# Patient Record
Sex: Female | Born: 1995 | Race: Black or African American | Hispanic: No | Marital: Single | Smoking: Never smoker
Health system: Southern US, Community
[De-identification: ages and names within clinical notes are randomized; demographics above are authoritative.]

## PROBLEM LIST (undated history)

## (undated) DIAGNOSIS — J45909 Unspecified asthma, uncomplicated: Secondary | ICD-10-CM

---
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
## 2015-01-27 ENCOUNTER — Emergency Department
Admission: EM | Admit: 2015-01-27 | Discharge: 2015-01-27 | Disposition: A | Discharge status: Home / Self Care | Payer: Self-pay | Attending: Emergency Medicine | Admitting: Emergency Medicine

## 2015-01-27 ENCOUNTER — Encounter: Payer: Self-pay | Admitting: Emergency Medicine

## 2015-01-27 DIAGNOSIS — R109 Unspecified abdominal pain: Secondary | ICD-10-CM | POA: Insufficient documentation

## 2015-01-27 HISTORY — DX: Unspecified asthma, uncomplicated: J45.909

## 2015-01-27 NOTE — ED Notes (Signed)
Patient ambulatory to triage with steady gait, without difficulty or distress noted; pt reports having mid abd and right sided abd pain for last few hours, no accomp symptoms; denies hx of same

## 2018-01-22 ENCOUNTER — Encounter: Payer: Self-pay | Admitting: Emergency Medicine

## 2018-01-22 ENCOUNTER — Other Ambulatory Visit: Payer: Self-pay

## 2018-01-22 ENCOUNTER — Ambulatory Visit
Admission: EM | Admit: 2018-01-22 | Discharge: 2018-01-22 | Disposition: A | Discharge status: Home / Self Care | Payer: BC Managed Care – PPO

## 2018-01-22 DIAGNOSIS — H01111 Allergic dermatitis of right upper eyelid: Secondary | ICD-10-CM

## 2018-01-22 DIAGNOSIS — H01113 Allergic dermatitis of right eye, unspecified eyelid: Secondary | ICD-10-CM

## 2018-01-22 DIAGNOSIS — H01116 Allergic dermatitis of left eye, unspecified eyelid: Secondary | ICD-10-CM

## 2018-01-22 DIAGNOSIS — H01114 Allergic dermatitis of left upper eyelid: Secondary | ICD-10-CM

## 2018-01-22 MED ORDER — PREDNISONE 20 MG PO TABS: 40.0000 mg | ORAL_TABLET | Freq: Every day | ORAL | 0 refills | Status: AC

## 2018-01-22 NOTE — ED Triage Notes (Signed)
Patient in today c/o 2-3 days of eyes being red, swollen, irritated and some matting. Patient has used OTC Benadryl with some relief.

## 2018-01-22 NOTE — Discharge Instructions (Signed)
Take medication as prescribed. Rest. Drink plenty of fluids.  Avoid rubbing.  Take over-the-counter antihistamine as discussed.  Cool compresses.  Follow up with your primary care physician this week as needed. Return to Urgent care for new or worsening concerns.

## 2018-01-22 NOTE — ED Provider Notes (Signed)
[facility] ____________________________________________  Time seen: Approximately 8:23 AM  I have reviewed the triage vital signs and the nursing notes.   HISTORY  Chief Complaint eye redness   HPI Becky Calhoun is a 22 y.o. female presenting for evaluation of bilateral eyelid itching and some swelling present since Sunday morning.  Reports eyes do feel somewhat itchy, but no pain.  Denies any eye pain, vision changes, vision loss, foreign body sensation, foreign bodies.  Denies any eye drainage.  No surrounding tenderness, swelling or redness.  No accompanying cough, congestion, fever, sore throat or rash.  Does not usually have seasonal allergies.  Does report Friday she had a touch of her eyelash extensions bilaterally to the top lids.  States that this is something that she has had before without any issues.  She did try Benadryl over-the-counter yesterday which did not make much change.  Reports both eyes felt very itchy and she has been frequently rubbing her eyelids.  Steady gait Reports otherwise feels well. Denies chest pain, shortness of breath, abdominal pain. Denies recent sickness. Denies recent antibiotic use.   No LMP recorded. (Menstrual status: Oral contraceptives).Denies pregnancy.   Past Medical History:  Diagnosis Date  . Asthma     There are no active problems to display for this patient.   History reviewed. No pertinent surgical history.   No current facility-administered medications for this encounter.   Current Outpatient Medications:  .  ENSKYCE 0.15-30 MG-MCG tablet, Take 1 tablet by mouth daily., Disp: , Rfl: 3 .  montelukast (SINGULAIR) 10 MG tablet, Take 10 mg by mouth daily., Disp: , Rfl:  .  predniSONE (DELTASONE) 20 MG tablet, Take 2 tablets (40 mg total) by mouth daily., Disp: 10 tablet, Rfl: 0  Allergies Patient has no known allergies.  Family History  Problem Relation Age of Onset  . Asthma Mother   . Healthy Father      Social History Social History   Tobacco Use  . Smoking status: Never Smoker  . Smokeless tobacco: Never Used  Substance Use Topics  . Alcohol use: No  . Drug use: No    Review of Systems Constitutional: No fever/chills Eyes: No visual changes. ENT: No sore throat. Cardiovascular: Denies chest pain. Respiratory: Denies shortness of breath. Gastrointestinal: No abdominal pain.   Skin: Negative for rash.   ____________________________________________   PHYSICAL EXAM:  VITAL SIGNS: ED Triage Vitals [01/22/18 0819]  Enc Vitals Group     BP 126/86     Pulse Rate 91     Resp 16     Temp 99.7 F (37.6 C)     Temp Source Oral     SpO2 100 %     Weight 140 lb (63.5 kg)     Height  (1.702 m)     Head Circumference      Peak Flow      Pain Score 0     Pain Loc      Pain Edu?      Excl. in GC?     Constitutional: Alert and oriented. Well appearing and in no acute distress. Eyes: Conjunctivae are normal, no injection.  Bilateral upper eyelids along eyelash margin mild localized edema with minimal localized erythema, no surrounding erythema, nontender.  No surrounding tenderness, swelling or erythema.  No foreign bodies visualized bilaterally.  No drainage bilaterally.  PERRL. EOMI. no pain with EOMs. ENT      Head: Normocephalic and atraumatic.  Ears: Nontender, no erythema, normal TMs bilaterally.      Nose: No congestion      Mouth/Throat: Mucous membranes are moist.Oropharynx non-erythematous. Neck: No stridor. Supple without meningismus.  Cardiovascular: Normal rate, regular rhythm. Grossly normal heart sounds.  Good peripheral circulation. Respiratory: Normal respiratory effort without tachypnea nor retractions. Breath sounds are clear and equal bilaterally. No wheezes, rales, rhonchi. Musculoskeletal:  Steady gait. Neurologic:  Normal speech and language. Speech is normal. No gait instability.  Skin:  Skin is warm, dry and intact. No rash  noted. Psychiatric: Mood and affect are normal. Speech and behavior are normal. Patient exhibits appropriate insight and judgment   ___________________________________________   LABS (all labs ordered are listed, but only abnormal results are displayed)   PROCEDURES Procedures   INITIAL IMPRESSION / ASSESSMENT AND PLAN / ED COURSE  Pertinent labs & imaging results that were available during my care of the patient were reviewed by me and considered in my medical decision making (see chart for details).  Well-appearing patient.  No acute distress.  Suspect allergic contact dermatitis to bilateral eyelids secondary to eyelash extensions.  Continue oral over-the-counter antihistamines, will start 5-day course of prednisone.  Cool compresses and avoidance of rubbing.  Also cautioned regarding possible continuation without removal of eyelashes, patient verbalized understanding.  Encourage supportive care.Discussed indication, risks and benefits of medications with patient.  Discussed follow up with Primary care physician this week. Discussed follow up and return parameters including no resolution or any worsening concerns. Patient verbalized understanding and agreed to plan.   ____________________________________________   FINAL CLINICAL IMPRESSION(S) / ED DIAGNOSES  Final diagnoses:  Allergic contact dermatitis of eyelid of left eye  Eyelid dermatitis, allergic/contact, right     ED Discharge Orders        Ordered    predniSONE (DELTASONE) 20 MG tablet  Daily     01/22/18 0840       Note: This dictation was prepared with Dragon dictation along with smaller phrase technology. Any transcriptional errors that result from this process are unintentional.         Renford Dills, NP 01/22/18 [phone]
